# Patient Record
Sex: Female | Born: 1980 | Race: Black or African American | Hispanic: No | Marital: Single | Smoking: Never smoker
Health system: Southern US, Community
[De-identification: ages and names within clinical notes are randomized; demographics above are authoritative.]

## PROBLEM LIST (undated history)

## (undated) DIAGNOSIS — N83209 Unspecified ovarian cyst, unspecified side: Secondary | ICD-10-CM

## (undated) DIAGNOSIS — D259 Leiomyoma of uterus, unspecified: Secondary | ICD-10-CM

## (undated) HISTORY — PX: UTERINE FIBROID SURGERY: SHX826

---

## 2015-08-31 HISTORY — PX: BILATERAL SALPINGECTOMY: SHX5743

## 2017-07-16 ENCOUNTER — Emergency Department (HOSPITAL_COMMUNITY)
Admission: EM | Admit: 2017-07-16 | Discharge: 2017-07-16 | Disposition: A | Discharge status: Home / Self Care | Payer: 59 | Attending: Emergency Medicine | Admitting: Emergency Medicine

## 2017-07-16 ENCOUNTER — Emergency Department (HOSPITAL_COMMUNITY): Payer: 59

## 2017-07-16 ENCOUNTER — Other Ambulatory Visit: Payer: Self-pay

## 2017-07-16 ENCOUNTER — Encounter (HOSPITAL_COMMUNITY): Payer: Self-pay

## 2017-07-16 DIAGNOSIS — B373 Candidiasis of vulva and vagina: Secondary | ICD-10-CM | POA: Diagnosis not present

## 2017-07-16 DIAGNOSIS — N839 Noninflammatory disorder of ovary, fallopian tube and broad ligament, unspecified: Secondary | ICD-10-CM | POA: Insufficient documentation

## 2017-07-16 DIAGNOSIS — R300 Dysuria: Secondary | ICD-10-CM | POA: Insufficient documentation

## 2017-07-16 DIAGNOSIS — N838 Other noninflammatory disorders of ovary, fallopian tube and broad ligament: Secondary | ICD-10-CM

## 2017-07-16 DIAGNOSIS — R102 Pelvic and perineal pain: Secondary | ICD-10-CM | POA: Diagnosis not present

## 2017-07-16 DIAGNOSIS — R112 Nausea with vomiting, unspecified: Secondary | ICD-10-CM | POA: Diagnosis not present

## 2017-07-16 DIAGNOSIS — N83209 Unspecified ovarian cyst, unspecified side: Secondary | ICD-10-CM

## 2017-07-16 DIAGNOSIS — R1032 Left lower quadrant pain: Secondary | ICD-10-CM | POA: Diagnosis present

## 2017-07-16 DIAGNOSIS — B3731 Acute candidiasis of vulva and vagina: Secondary | ICD-10-CM

## 2017-07-16 LAB — CBC WITH DIFFERENTIAL/PLATELET
Basophils Absolute: 0 10*3/uL (ref 0.0–0.1)
Basophils Relative: 1 %
Eosinophils Absolute: 0.2 10*3/uL (ref 0.0–0.7)
Eosinophils Relative: 2 %
HCT: 37.3 % (ref 36.0–46.0)
Hemoglobin: 12.3 g/dL (ref 12.0–15.0)
Lymphocytes Relative: 40 %
Lymphs Abs: 2.8 10*3/uL (ref 0.7–4.0)
MCH: 25.9 pg — ABNORMAL LOW (ref 26.0–34.0)
MCHC: 33 g/dL (ref 30.0–36.0)
MCV: 78.5 fL (ref 78.0–100.0)
Monocytes Absolute: 0.5 10*3/uL (ref 0.1–1.0)
Monocytes Relative: 7 %
Neutro Abs: 3.5 10*3/uL (ref 1.7–7.7)
Neutrophils Relative %: 50 %
Platelets: 298 10*3/uL (ref 150–400)
RBC: 4.75 MIL/uL (ref 3.87–5.11)
RDW: 13.5 % (ref 11.5–15.5)
WBC: 7 10*3/uL (ref 4.0–10.5)

## 2017-07-16 LAB — WET PREP, GENITAL
Clue Cells Wet Prep HPF POC: NONE SEEN
Sperm: NONE SEEN
Trich, Wet Prep: NONE SEEN
WBC, Wet Prep HPF POC: NONE SEEN

## 2017-07-16 LAB — COMPREHENSIVE METABOLIC PANEL
ALT: 13 U/L — ABNORMAL LOW (ref 14–54)
AST: 14 U/L — ABNORMAL LOW (ref 15–41)
Albumin: 3.9 g/dL (ref 3.5–5.0)
Alkaline Phosphatase: 35 U/L — ABNORMAL LOW (ref 38–126)
Anion gap: 9 (ref 5–15)
BUN: 13 mg/dL (ref 6–20)
CO2: 23 mmol/L (ref 22–32)
Calcium: 9.3 mg/dL (ref 8.9–10.3)
Chloride: 107 mmol/L (ref 101–111)
Creatinine, Ser: 0.83 mg/dL (ref 0.44–1.00)
GFR calc Af Amer: >60 mL/min (ref >60)
GFR calc non Af Amer: >60 mL/min (ref >60)
Glucose, Bld: 89 mg/dL (ref 65–99)
Potassium: 3.4 mmol/L — ABNORMAL LOW (ref 3.5–5.1)
Sodium: 139 mmol/L (ref 135–145)
Total Bilirubin: 0.7 mg/dL (ref 0.3–1.2)
Total Protein: 7.8 g/dL (ref 6.5–8.1)

## 2017-07-16 LAB — URINALYSIS, ROUTINE W REFLEX MICROSCOPIC
Bilirubin Urine: NEGATIVE
Glucose, UA: NEGATIVE mg/dL
Hgb urine dipstick: NEGATIVE
Ketones, ur: 20 mg/dL — AB
Leukocytes, UA: NEGATIVE
Nitrite: NEGATIVE
Protein, ur: NEGATIVE mg/dL
Specific Gravity, Urine: 1.026 (ref 1.005–1.030)
pH: 5 (ref 5.0–8.0)

## 2017-07-16 LAB — LIPASE, BLOOD: Lipase: 32 U/L (ref 11–51)

## 2017-07-16 LAB — HCG, QUANTITATIVE, PREGNANCY: hCG, Beta Chain, Quant, S: <1 m[IU]/mL (ref <5)

## 2017-07-16 MED ORDER — OXYCODONE HCL 5 MG PO TABS: 5.0000 mg | ORAL_TABLET | Freq: Four times a day (QID) | ORAL | 0 refills | Status: DC | PRN

## 2017-07-16 MED ORDER — FLUCONAZOLE 150 MG PO TABS
150.0000 mg | ORAL_TABLET | Freq: Once | ORAL | Status: AC
  Administered 2017-07-16: 150 mg via ORAL
  Filled 2017-07-16: qty 1

## 2017-07-16 MED ORDER — MORPHINE SULFATE (PF) 4 MG/ML IV SOLN
4.0000 mg | Freq: Once | INTRAVENOUS | Status: AC
  Administered 2017-07-16: 4 mg via INTRAVENOUS
  Filled 2017-07-16: qty 1

## 2017-07-16 MED ORDER — ONDANSETRON HCL 4 MG PO TABS: 4.0000 mg | ORAL_TABLET | Freq: Three times a day (TID) | ORAL | 0 refills | Status: AC | PRN

## 2017-07-16 MED ORDER — ONDANSETRON HCL 4 MG/2ML IJ SOLN
4.0000 mg | Freq: Once | INTRAMUSCULAR | Status: AC
  Administered 2017-07-16: 4 mg via INTRAVENOUS
  Filled 2017-07-16: qty 2

## 2017-07-16 MED ORDER — ONDANSETRON HCL 4 MG/2ML IJ SOLN: 4.0000 mg | Freq: Once | INTRAMUSCULAR | Status: DC

## 2017-07-16 MED ORDER — SODIUM CHLORIDE 0.9 % IV BOLUS
1000.0000 mL | Freq: Once | INTRAVENOUS | Status: AC
  Administered 2017-07-16: 1000 mL via INTRAVENOUS

## 2017-07-16 NOTE — Discharge Instructions (Addendum)
Alternate 600 mg of ibuprofen and [phone] mg of Tylenol every 3 hours as needed for pain. Do not exceed 4000 mg of Tylenol daily.  Take ibuprofen with food to avoid upset stomach issues.  You may take oxycodone as needed for severe pain but do not drive, drink alcohol, or operate heavy machinery if you take this medication as it may make you drowsy.  You may take Zofran as needed for nausea and vomiting.  Wait around 20 minutes before having anything to eat or drink to get this medication time to work.  Drink plenty of fluids and get plenty of rest.  You may apply heating pad to the abdomen for comfort.  Follow-up in the women's outpatient clinic as soon as possible in the next 2 to 3 days.  Return to the emergency department or go to the [facility] if any concerning signs or symptoms develop such as high fevers, persistent vomiting, or worsening pain.

## 2017-07-16 NOTE — ED Notes (Signed)
Pelvic setup in room 

## 2017-07-16 NOTE — ED Triage Notes (Signed)
Pt sent from doctors office with a copy of her abdominal CT scan. She is complaining of lower abdominal/ pelvic pain. States that she has not vomited today, but did yesterday. A&Ox4.

## 2017-07-16 NOTE — ED Provider Notes (Signed)
[facility] DEPT Provider Note   CSN: [id number] Arrival date & time: 07/16/17  1512     History   Chief Complaint Chief Complaint  Patient presents with  . Abdominal Pain    HPI Danielle Welch is a 37 y.o. female with no significant past medical history presents today for evaluation of acute onset, per aggressively worsening left lower quadrant abdominal pain for 5 days.  She states that pain is constant, burning, worsens when she urinates and with movement.  Pain will sometimes radiate to the left lower back.  She has had associated nausea and a few episodes of nonbloody nonbilious emesis.  None today but she states she has not had anything to eat.  She did note subjective fevers and chills last night.  She notes that she has been constipated and only had a small bowel movement 3 days ago.  She feels as though it is difficult to pass gas additionally.  She does note dysuria but denies hematuria, no melena or hematochezia.  Has not tried anything for her symptoms. she went to a clinic earlier today and underwent a CT scan which showed 12.8 x 8.5 x 8.2 cm cystic lesion along the left posterior uterus with at least one septation, potentially adnexal or ovarian cyst or hydrosalpinx.  She also had a right-sided fluid collection along the uterine margin measuring 4.2 x 5.7 x 4.7 cm.  There is question of free fluid as well.  She did receive a shot of pain medicine while in the clinic which she states was helpful for her pain.  She states her last menstrual period was in late April and was of normal duration for her.  She states her periods are usually regular and last approximately 3 to 4 days.  The history is provided by the patient.    History reviewed. No pertinent past medical history.  There are no active problems to display for this patient.   History reviewed. No pertinent surgical history.   OB History   None      Home Medications    Prior to  Admission medications   Medication Sig Start Date End Date Taking? Authorizing Provider  ibuprofen (ADVIL,MOTRIN) 200 MG tablet Take 200 mg by mouth every 6 (six) hours as needed for moderate pain.   Yes [provider]  ondansetron (ZOFRAN) 4 MG tablet Take 1 tablet (4 mg total) by mouth every 8 (eight) hours as needed for nausea or vomiting. 07/16/17   Nils Flack, Raciel Caffrey A, PA-C  oxyCODONE (ROXICODONE) 5 MG immediate release tablet Take 1 tablet (5 mg total) by mouth every 6 (six) hours as needed for severe pain. 07/16/17   Renita Papa, PA-C    Family History History reviewed. No pertinent family history.  Social History Social History   Tobacco Use  . Smoking status: Never Smoker  . Smokeless tobacco: Never Used  Substance Use Topics  . Alcohol use: Not on file  . Drug use: Not on file     Allergies   Patient has no known allergies.   Review of Systems Review of Systems  Constitutional: Positive for chills and fever.  Gastrointestinal: Positive for abdominal pain, constipation, nausea and vomiting. Negative for blood in stool and diarrhea.  Genitourinary: Positive for dysuria and pelvic pain. Negative for frequency and hematuria.  All other systems reviewed and are negative.    Physical Exam Updated Vital Signs BP 120/68 (BP Location: Left Arm)   Pulse 72   Temp  98.9 F (37.2 C) (Oral)   Resp 18   SpO2 100%   Physical Exam  Constitutional: She appears well-developed and well-nourished. No distress.  HENT:  Head: Normocephalic and atraumatic.  Eyes: Conjunctivae are normal. Right eye exhibits no discharge. Left eye exhibits no discharge.  Neck: No JVD present. No tracheal deviation present.  Cardiovascular: Normal rate, regular rhythm, normal heart sounds and intact distal pulses.  Pulmonary/Chest: Effort normal and breath sounds normal.  Abdominal: She exhibits distension and mass. Bowel sounds are decreased. There is tenderness in the suprapubic area and left  lower quadrant. There is guarding. There is no rigidity, no rebound, no CVA tenderness and negative Murphy's sign.  Well-healed surgical incision in the suprapubic region. There is a palpable mass int he left lower quadrant around the pelvic brim  Genitourinary: Vagina normal. Uterus is tender. Cervix exhibits motion tenderness. Cervix exhibits no friability. Left adnexum displays mass and tenderness.  Genitourinary Comments: Examination performed in the presence of a chaperone.  No masses or lesions to the external genitalia.  Musculoskeletal: She exhibits no edema.  Neurological: She is alert.  Skin: Skin is warm and dry. No erythema.  Psychiatric: She has a normal mood and affect. Her behavior is normal.  Nursing note and vitals reviewed.    ED Treatments / Results  Labs (all labs ordered are listed, but only abnormal results are displayed) Labs Reviewed  WET PREP, GENITAL - Abnormal; Notable for the following components:      Result Value   Yeast Wet Prep HPF POC PRESENT (*)    All other components within normal limits  CBC WITH DIFFERENTIAL/PLATELET - Abnormal; Notable for the following components:   MCH 25.9 (*)    All other components within normal limits  COMPREHENSIVE METABOLIC PANEL - Abnormal; Notable for the following components:   Potassium 3.4 (*)    AST 14 (*)    ALT 13 (*)    Alkaline Phosphatase 35 (*)    All other components within normal limits  URINALYSIS, ROUTINE W REFLEX MICROSCOPIC - Abnormal; Notable for the following components:   APPearance HAZY (*)    Ketones, ur 20 (*)    All other components within normal limits  LIPASE, BLOOD  HCG, QUANTITATIVE, PREGNANCY  GC/CHLAMYDIA PROBE AMP ([place]) NOT AT [facility]    EKG None  Radiology US Pelvic Complete With Transvaginal  Result Date: 07/16/2017 CLINICAL DATA:  Ovarian mass. EXAM: TRANSABDOMINAL AND TRANSVAGINAL ULTRASOUND OF PELVIS TECHNIQUE: Both transabdominal and transvaginal ultrasound  examinations of the pelvis were performed. Transabdominal technique was performed for global imaging of the pelvis including uterus, ovaries, adnexal regions, and pelvic cul-de-sac. It was necessary to proceed with endovaginal exam following the transabdominal exam to visualize the ovaries. COMPARISON:  None. FINDINGS: Uterus Measurements: 11.8 x 5.1 x 5.9 cm. Heterogeneous in appearance with 2.8 cm intramural fibroid in the posterior fundus. Endometrium Thickness: 5 mm.  No focal abnormality visualized. Right ovary Measurements: 6.0 x 3.4 x 4.5 cm. Simple appearing, anechoic 5.1 x 2.9 x 3.5 cm cyst within the right ovary. Left ovary A normal left ovary is not identified. There is a large complex, cystic mass involving the left ovary, measuring 11.5 x 7.8 x 11.0 cm. This mass contains several thin internal septations, as well as a hypoechoic nodule without internal vascularity. There is an adjacent tubular shaped lesion with low-level internal echoes that may represent the fallopian tube. Other findings No abnormal free fluid. IMPRESSION: 1. Large 11.5 cm complex, cystic  mass involving the left ovary containing several thin internal septations and a hypoechoic nodule without definite internal vascularity. Given this appearance, gynecological consultation and contrast-enhanced pelvic MRI are recommended for further evaluation. This recommendation follows the consensus statement: Management of Asymptomatic Ovarian and Other Adnexal Cysts Imaged at [place]: Society of Radiologists in [place]. Radiology 2010; [phone]. 2. Tubular lesion containing low-level internal echoes within the left adnexa that appears separate from the left ovary may represent hematosalpinx. 3. 5.1 cm simple appearing cyst in the right ovary. This is almost certainly benign, but follow up ultrasound is recommended in 1 year according to the Society of Radiologists in Ultrasound 2010 Consensus Conference Statement  (D Clovis Riley et al. Management of Asymptomatic Ovarian and Other Adnexal Cysts Imaged at [place]: Society of Radiologists in [place] Statement 2010. Radiology 256 (Sept 2010): [phone].). 4. Fibroid uterus. Electronically Signed   By: Titus Dubin M.D.   On: 07/16/2017 17:45    Procedures Procedures (including critical care time)  Medications Ordered in ED Medications  ondansetron (ZOFRAN) injection 4 mg (has no administration in time range)  morphine 4 MG/ML injection 4 mg (4 mg Intravenous Given 07/16/17 2106)  sodium chloride 0.9 % bolus 1,000 mL (0 mLs Intravenous Stopped 07/16/17 2255)  ondansetron (ZOFRAN) injection 4 mg (4 mg Intravenous Given 07/16/17 2104)  fluconazole (DIFLUCAN) tablet 150 mg (150 mg Oral Given 07/16/17 2255)     Initial Impression / Assessment and Plan / ED Course  I have reviewed the triage vital signs and the nursing notes.  Pertinent labs & imaging results that were available during my care of the patient were reviewed by me and considered in my medical decision making (see chart for details).     Patient presents for evaluation of left-sided abdominal/pelvic pain for 5 days.  Febrile, vital signs are stable.  She is uncomfortable on palpation of the abdomen but nontoxic in appearance.  She presents with a report of a CT scan that was performed earlier today which shows a large left-sided pelvic mass and a smaller right-sided pelvic mass.  Mass is palpable on examination. Pelvic ultrasound performed today in the ED shows large 11.8 cm complex, cystic mass involving the left ovary containing several thin internal septations and a hypoechoic nodule without definite internal vascularity.  She also has 5.1 cm simple appearing cyst in the right ovary which radiology feels is almost certainly benign.  Lab work reviewed by me shows no leukocytosis, no significant electrolyte abnormalities.  Creatinine, lipase, LFTs are not elevated.  She does have yeast on  her wet prep.  Will give Diflucan in the ED. Spoke with Dr. Elonda Husky with OB/GYN service who states that patient's mass will require surgical removal but this does not need to happen on an emergent basis.  He recommends that she follow-up in the women's clinic on an outpatient basis as soon as possible.  On reevaluation the patient is resting comfortably in no apparent distress.  She was given fluids, pain medicine, and nausea medicine and states she is feeling much better.  She is tolerating p.o. fluids without difficulty and serial abdominal examinations are reassuring.  I doubt obstruction, perforation, appendicitis, colitis, or other acute surgical abdominal pathology.  I had a long discussion with the patient regarding her work-up and answered her and her fianc's questions.  We will discharge with a small amount of oxycodone for severe pain and Zofran for nausea.  Discussed strict ED return precautions.  Patient and patient's  fianc verbalized understanding of and agreement with plan and patient is stable for discharge home at this time.  Final Clinical Impressions(s) / ED Diagnoses   Final diagnoses:  Ovarian mass, left  Yeast vaginitis    ED Discharge Orders        Ordered    oxyCODONE (ROXICODONE) 5 MG immediate release tablet  Every 6 hours PRN     07/16/17 2158    ondansetron (ZOFRAN) 4 MG tablet  Every 8 hours PRN     07/16/17 2158       Renita Papa, PA-C 07/16/17 2340    Mesner, Corene Cornea, MD 07/17/17 [phone]

## 2017-07-19 ENCOUNTER — Inpatient Hospital Stay (HOSPITAL_COMMUNITY)
Admission: AD | Admit: 2017-07-19 | Discharge: 2017-07-19 | Disposition: A | Discharge status: Home / Self Care | Payer: 59 | Source: Ambulatory Visit | Attending: Obstetrics and Gynecology | Admitting: Obstetrics and Gynecology

## 2017-07-19 ENCOUNTER — Encounter (HOSPITAL_COMMUNITY): Payer: Self-pay | Admitting: *Deleted

## 2017-07-19 ENCOUNTER — Inpatient Hospital Stay (HOSPITAL_COMMUNITY): Payer: 59

## 2017-07-19 DIAGNOSIS — R1900 Intra-abdominal and pelvic swelling, mass and lump, unspecified site: Secondary | ICD-10-CM | POA: Insufficient documentation

## 2017-07-19 DIAGNOSIS — R350 Frequency of micturition: Secondary | ICD-10-CM | POA: Diagnosis not present

## 2017-07-19 DIAGNOSIS — Z79899 Other long term (current) drug therapy: Secondary | ICD-10-CM | POA: Diagnosis not present

## 2017-07-19 DIAGNOSIS — R1030 Lower abdominal pain, unspecified: Secondary | ICD-10-CM | POA: Insufficient documentation

## 2017-07-19 DIAGNOSIS — R300 Dysuria: Secondary | ICD-10-CM | POA: Diagnosis present

## 2017-07-19 DIAGNOSIS — N83201 Unspecified ovarian cyst, right side: Secondary | ICD-10-CM | POA: Diagnosis not present

## 2017-07-19 DIAGNOSIS — R109 Unspecified abdominal pain: Secondary | ICD-10-CM

## 2017-07-19 DIAGNOSIS — N838 Other noninflammatory disorders of ovary, fallopian tube and broad ligament: Secondary | ICD-10-CM

## 2017-07-19 DIAGNOSIS — R1032 Left lower quadrant pain: Secondary | ICD-10-CM | POA: Diagnosis not present

## 2017-07-19 DIAGNOSIS — R509 Fever, unspecified: Secondary | ICD-10-CM | POA: Diagnosis present

## 2017-07-19 HISTORY — DX: Leiomyoma of uterus, unspecified: D25.9

## 2017-07-19 HISTORY — DX: Unspecified ovarian cyst, unspecified side: N83.209

## 2017-07-19 LAB — URINALYSIS, ROUTINE W REFLEX MICROSCOPIC
BILIRUBIN URINE: NEGATIVE
Glucose, UA: NEGATIVE mg/dL
Hgb urine dipstick: NEGATIVE
KETONES UR: NEGATIVE mg/dL
LEUKOCYTES UA: NEGATIVE
NITRITE: NEGATIVE
PH: 7 (ref 5.0–8.0)
Protein, ur: NEGATIVE mg/dL
SPECIFIC GRAVITY, URINE: 1.011 (ref 1.005–1.030)

## 2017-07-19 LAB — CBC
HCT: 38.8 % (ref 36.0–46.0)
Hemoglobin: 12.9 g/dL (ref 12.0–15.0)
MCH: 26.5 pg (ref 26.0–34.0)
MCHC: 33.2 g/dL (ref 30.0–36.0)
MCV: 79.8 fL (ref 78.0–100.0)
PLATELETS: 285 10*3/uL (ref 150–400)
RBC: 4.86 MIL/uL (ref 3.87–5.11)
RDW: 13.6 % (ref 11.5–15.5)
WBC: 6.2 10*3/uL (ref 4.0–10.5)

## 2017-07-19 LAB — POCT PREGNANCY, URINE: PREG TEST UR: NEGATIVE

## 2017-07-19 LAB — GC/CHLAMYDIA PROBE AMP (~~LOC~~) NOT AT ARMC
Chlamydia: NEGATIVE
Neisseria Gonorrhea: NEGATIVE

## 2017-07-19 MED ORDER — OXYCODONE-ACETAMINOPHEN 5-325 MG PO TABS
1.0000 | ORAL_TABLET | Freq: Three times a day (TID) | ORAL | 0 refills | Status: AC | PRN
Start: 2017-07-19 — End: ?

## 2017-07-19 MED ORDER — KETOROLAC TROMETHAMINE 60 MG/2ML IM SOLN
60.0000 mg | Freq: Once | INTRAMUSCULAR | Status: AC
  Administered 2017-07-19: 60 mg via INTRAMUSCULAR
  Filled 2017-07-19: qty 2

## 2017-07-19 MED ORDER — IBUPROFEN 800 MG PO TABS: 800.0000 mg | ORAL_TABLET | Freq: Three times a day (TID) | ORAL | 0 refills | Status: DC

## 2017-07-19 NOTE — MAU Note (Signed)
Pt reports lower abd pain x one week, was evaluated. Reports dysuria, voiding small amounts and ? Fever off/on

## 2017-07-19 NOTE — MAU Provider Note (Signed)
History     CSN: [id number]  Arrival date and time: 07/19/17 0263   First Provider Initiated Contact with Patient 07/19/17 1106      Chief Complaint  Patient presents with  . Abdominal Pain  . Dysuria  . Fever   HPI  Danielle Welch is a 37 y.o. G0P0000 non pregnant female who presents with abdominal pain. She was seen 5/17 at [facility] for same complaint. Has hx of PID, TOA, & bilateral salpingectomy in 2017 in [place] (see care everywhere records). On 5/17, she had imaging that shows a 5 cm right ovarian cyst, a lesion in left adnexa c/w hematosalpinx, & a 11.5 cm left ovarian mass. Was discharged home with f/u scheduled for 6/14 in our ob/gyn clinic.  Patient reports lower abdominal pain that started over a week ago and has progressively gotten worse. Pain is throughout her abdomen but worse in LLQ. Rates pain 10/10. Took percocet yesterday with mild relief. Has not taken medication today.  Per charge RN, pt reports dysuria, difficulty emptying bladder, and occasional fevers at home. During my discussion with patient she denies fevers or dysuria, but does reports increased urinary frequency.  Her PCP sent her to [facility] for CT scan --- she brought records of scan results with her; CT was performed 5/17.   Past Medical History:  Diagnosis Date  . Ovarian cyst   . Uterine fibroid     Past Surgical History:  Procedure Laterality Date  . BILATERAL SALPINGECTOMY    . UTERINE FIBROID SURGERY      No family history on file.  Social History   Tobacco Use  . Smoking status: Never Smoker  . Smokeless tobacco: Never Used  Substance Use Topics  . Alcohol use: Never    Frequency: Never  . Drug use: Never    Allergies: No Known Allergies  Medications Prior to Admission  Medication Sig Dispense Refill Last Dose  . ibuprofen (ADVIL,MOTRIN) 200 MG tablet Take 200 mg by mouth every 6 (six) hours as needed for moderate pain.   07/15/2017 at Unknown time  . ondansetron (ZOFRAN)  4 MG tablet Take 1 tablet (4 mg total) by mouth every 8 (eight) hours as needed for nausea or vomiting. 12 tablet 0   . oxyCODONE (ROXICODONE) 5 MG immediate release tablet Take 1 tablet (5 mg total) by mouth every 6 (six) hours as needed for severe pain. 20 tablet 0     Review of Systems  Constitutional: Negative.   Gastrointestinal: Positive for abdominal distention, abdominal pain, nausea and vomiting. Negative for constipation and diarrhea.  Genitourinary: Negative.    Physical Exam   Blood pressure 117/87, pulse 77, temperature 98.6 F (37 C), temperature source Oral, resp. rate 18, height 5\' 2"  (1.575 m), weight 183 lb (83 kg), last menstrual period 06/28/2017, SpO2 100 %.  Physical Exam  Nursing note and vitals reviewed. Constitutional: She is oriented to person, place, and time. She appears well-developed and well-nourished. No distress.  HENT:  Head: Normocephalic and atraumatic.  Eyes: Conjunctivae are normal. Right eye exhibits no discharge. Left eye exhibits no discharge. No scleral icterus.  Neck: Normal range of motion.  Cardiovascular: Normal rate, regular rhythm and normal heart sounds.  No murmur heard. Respiratory: Effort normal and breath sounds normal. No respiratory distress. She has no wheezes.  GI: Soft. Bowel sounds are normal. She exhibits distension. There is tenderness in the right lower quadrant and suprapubic area. There is no rigidity, no rebound and no guarding.  Neurological: She is alert and oriented to person, place, and time.  Skin: Skin is warm and dry. She is not diaphoretic.  Psychiatric: She has a normal mood and affect. Her behavior is normal. Judgment and thought content normal.    MAU Course  Procedures Results for orders placed or performed during the hospital encounter of 07/19/17 (from the past 24 hour(s))  Urinalysis, Routine w reflex microscopic     Status: None   Collection Time: 07/19/17  9:42 AM  Result Value Ref Range   Color,  Urine YELLOW YELLOW   APPearance CLEAR CLEAR   Specific Gravity, Urine 1.011 1.005 - 1.030   pH 7.0 5.0 - 8.0   Glucose, UA NEGATIVE NEGATIVE mg/dL   Hgb urine dipstick NEGATIVE NEGATIVE   Bilirubin Urine NEGATIVE NEGATIVE   Ketones, ur NEGATIVE NEGATIVE mg/dL   Protein, ur NEGATIVE NEGATIVE mg/dL   Nitrite NEGATIVE NEGATIVE   Leukocytes, UA NEGATIVE NEGATIVE  Pregnancy, urine POC     Status: None   Collection Time: 07/19/17 11:10 AM  Result Value Ref Range   Preg Test, Ur NEGATIVE NEGATIVE  CBC     Status: None   Collection Time: 07/19/17 12:07 PM  Result Value Ref Range   WBC 6.2 4.0 - 10.5 K/uL   RBC 4.86 3.87 - 5.11 MIL/uL   Hemoglobin 12.9 12.0 - 15.0 g/dL   HCT 38.8 36.0 - 46.0 %   MCV 79.8 78.0 - 100.0 fL   MCH 26.5 26.0 - 34.0 pg   MCHC 33.2 30.0 - 36.0 g/dL   RDW 13.6 11.5 - 15.5 %   Platelets 285 150 - 400 K/uL   US Pelvis (transabdominal Only)  Result Date: 07/19/2017 CLINICAL DATA:  Pelvic pain. Status post bilateral salpingectomy for tubo-ovarian abscess EXAM: TRANSABDOMINAL ULTRASOUND OF PELVIS TECHNIQUE: Transabdominal ultrasound examination of the pelvis was performed including evaluation of the uterus, ovaries, adnexal regions, and pelvic cul-de-sac. COMPARISON:  07/16/2017 FINDINGS: Uterus Measurements: 16.1 x 6.1 x 8.6 cm. At least 4 fibroids are identified. The largest is in the right lateral posterior myometrium measuring 2.5 x 2.6 x 2.6 cm. Endometrium Thickness: 5.2 mm.  No focal abnormality visualized. Right ovary Measurements: Not visualized.  Normal appearance/no adnexal mass. Left ovary Measurements: Not visualized.  Normal appearance/no adnexal mass. Other findings: Loculated fluid is identified within bilateral adnexal regions. Additionally, there is a large predominantly cystic mass within the left adnexal region measuring 11.4 x 7.7 x 9.6 cm. Indeterminate. IMPRESSION: 1. Again noted is loculated fluid within the pelvis as well as a large indeterminate  cystic mass within the left adnexal region. In a patient who has a history of tubo-ovarian abscess cannot rule out recurrent infection. Benign or malignant cystic not excluded. Further evaluation with contrast enhanced CT of the abdomen and pelvis or contrast enhanced pelvic MRI is advised for further characterization. 2. Nonvisualization of the ovaries. Electronically Signed   By: Kerby Moors M.D.   On: 07/19/2017 13:33    MDM UPT negative VSS, pt afebrile Toradol 60 mg IM -- brought pain down to 6/10 GC/CT & wet prep collected on 5/17, wet prep only showed yeast, GC/CT still pending  C/w Dr. Elly Modena. Reviewed imaging from today, CT from 5/17, and records from [place] in [place]. Will keep f/u appt with Dr. Hulan Fray. Rx meds for pain management. Ibuprofen on schedule and narcotic to take sparingly for breakthrough pain.   Called office and got pt earlier appointment on 6/7. Assessment and Plan  A:  1. Pelvic mass in female   2. Ovarian mass   3. Abdominal pain    P: Discharge home Rx ibuprofen 800 mg --- discussed with patient to take on schedule Rx percocet, take prn between ibuprofen - discussed using narcotics sparingly and possibility of constipation which would worsen her symptoms F/u with Dr. Hulan Fray on 6/7  Jorje Guild 07/19/2017, 11:06 AM

## 2017-07-19 NOTE — Discharge Instructions (Signed)
Constipation, Adult Constipation is when a person has fewer bowel movements in a week than normal, has difficulty having a bowel movement, or has stools that are dry, hard, or larger than normal. Constipation may be caused by an underlying condition. It may become worse with age if a person takes certain medicines and does not take in enough fluids. Follow these instructions at home: Eating and drinking   Eat foods that have a lot of fiber, such as fresh fruits and vegetables, whole grains, and beans.  Limit foods that are high in fat, low in fiber, or overly processed, such as french fries, hamburgers, cookies, candies, and soda.  Drink enough fluid to keep your urine clear or pale yellow. General instructions  Exercise regularly or as told by your health care provider.  Go to the restroom when you have the urge to go. Do not hold it in.  Take over-the-counter and prescription medicines only as told by your health care provider. These include any fiber supplements.  Practice pelvic floor retraining exercises, such as deep breathing while relaxing the lower abdomen and pelvic floor relaxation during bowel movements.  Watch your condition for any changes.  Keep all follow-up visits as told by your health care provider. This is important. Contact a health care provider if:  You have pain that gets worse.  You have a fever.  You do not have a bowel movement after 4 days.  You vomit.  You are not hungry.  You lose weight.  You are bleeding from the anus.  You have thin, pencil-like stools. Get help right away if:  You have a fever and your symptoms suddenly get worse.  You leak stool or have blood in your stool.  Your abdomen is bloated.  You have severe pain in your abdomen.  You feel dizzy or you faint. This information is not intended to replace advice given to you by your health care provider. Make sure you discuss any questions you have with your health care  provider. Document Released: 11/15/2003 Document Revised: 09/06/2015 Document Reviewed: 08/07/2015 Elsevier Interactive Patient Education  2018 [employer]. Pelvic Mass A pelvic mass is an abnormal growth in the pelvis. The pelvis is the area between your hip bones. It includes the bladder and the rectum in males and females, and also the uterus and ovaries in females. What are the causes? Many things can cause a pelvic mass, including:  Cancer.  Fibroids of the uterus.  Ovarian cysts.  Infection.  Ectopic pregnancy.  What are the signs or symptoms? Symptoms of a pelvic mass may include:  Cramping.  Nausea.  Diarrhea.  Fever.  Vomiting.  Weakness.  Pain in the pelvis, side, or back.  Weight loss.  Constipation.  Problems with vaginal bleeding, including: ? Light or heavy bleeding with or without blood clots. ? Irregular menstruation. ? Pain with menstruation.  Problems with urination, including: ? Frequent urination. ? Inability to empty the bladder completely. ? Urinating very small amounts. ? Pain with urination. ? Bloody urine.  Some pelvic masses do not cause symptoms. How is this diagnosed? To make a diagnosis, your health care provider will need to learn more about the mass. You may have tests or procedures done, such as:  Blood tests.  X-rays.  Ultrasound.  CT scan.  MRI.  A surgery to look inside of your abdomen with cameras (laparoscopy).  A biopsy that is performed with a needle or during laparoscopy or surgery.  In some cases, what seemed  like a pelvic mass may actually be something else, such as a mass in one of the organs that are near the pelvis, an infection (abscess) or scar tissue (adhesions) that formed after a surgery. How is this treated? Treatment will depend on the cause of the mass. Follow these instructions at home: What you need to do at home will depend on the cause of the mass. Follow the instructions that your  health care provider gives to you. In general:  Keep all follow-up visits as directed by your health care provider. This is important.  Take medicines only as directed by your health care provider.  Follow any restrictions that are given to you by your health care provider.  Contact a health care provider if:  You develop new symptoms. Get help right away if:  You vomit bright red blood or vomit material that looks like coffee grounds.  You have blood in your stools, or the stools turn black and tarry.  You have an abnormal or increased amount of vaginal bleeding.  You have a fever.  You develop easy bruising or bleeding.  You develop sudden or worsening pain that is not controlled by your medicine.  You feel worsening weakness, or you have a fainting episode.  You feel that the mass has suddenly gotten larger.  You develop severe bloating in your abdomen or your pelvis.  You cannot pass any urine.  You are unable to have a bowel movement. This information is not intended to replace advice given to you by your health care provider. Make sure you discuss any questions you have with your health care provider. Document Released: 05/26/2006 Document Revised: 07/25/2015 Document Reviewed: 10/02/2013 Elsevier Interactive Patient Education  2018 [employer].

## 2017-08-05 ENCOUNTER — Other Ambulatory Visit: Payer: Self-pay | Admitting: Student

## 2017-08-06 ENCOUNTER — Encounter: Payer: Self-pay | Admitting: General Practice

## 2017-08-06 ENCOUNTER — Ambulatory Visit (INDEPENDENT_AMBULATORY_CARE_PROVIDER_SITE_OTHER): Payer: 59 | Admitting: Obstetrics & Gynecology

## 2017-08-06 ENCOUNTER — Encounter: Payer: Self-pay | Admitting: Obstetrics & Gynecology

## 2017-08-06 VITALS — BP 118/79 | HR 67 | Ht 63.0 in | Wt 186.0 lb

## 2017-08-06 DIAGNOSIS — Z113 Encounter for screening for infections with a predominantly sexual mode of transmission: Secondary | ICD-10-CM | POA: Diagnosis not present

## 2017-08-06 DIAGNOSIS — Z23 Encounter for immunization: Secondary | ICD-10-CM | POA: Diagnosis not present

## 2017-08-06 DIAGNOSIS — N83202 Unspecified ovarian cyst, left side: Secondary | ICD-10-CM | POA: Diagnosis not present

## 2017-08-06 DIAGNOSIS — Z0000 Encounter for general adult medical examination without abnormal findings: Secondary | ICD-10-CM | POA: Diagnosis not present

## 2017-08-06 DIAGNOSIS — Z1151 Encounter for screening for human papillomavirus (HPV): Secondary | ICD-10-CM | POA: Diagnosis not present

## 2017-08-06 NOTE — Progress Notes (Signed)
Patient ID: Danielle Welch, female   DOB: 1980/12/18, 37 y.o.   MRN: [id number]  Chief Complaint  Patient presents with  . Follow-up    from ER    HPI Danielle Welch is a 37 y.o. female. Engaged P0 here for followup from ER visit done for pelvic pain. An 11 cm left adnexal mass was seen. 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 She reports that her pelvic pain has resolved. She is interested in IVF, does not want to have her uterus/ovaries removed.  HPI  Past Medical History:  Diagnosis Date  . Ovarian cyst   . Uterine fibroid     Past Surgical History:  Procedure Laterality Date  . BILATERAL SALPINGECTOMY Bilateral 08/2015  . UTERINE FIBROID SURGERY      No family history on file.  Social History Social History   Tobacco Use  . Smoking status: Never Smoker  . Smokeless tobacco: Never Used  Substance Use Topics  . Alcohol use: Never    Frequency: Never  . Drug use: Never    No Known Allergies  Current Outpatient Medications  Medication Sig Dispense Refill  . ibuprofen (ADVIL,MOTRIN) 600 MG tablet TAKE 1 TABLET BY MOUTH THREE TIMES A DAY 60 tablet 0  . ondansetron (ZOFRAN) 4 MG tablet Take 1 tablet (4 mg total) by mouth every 8 (eight) hours as needed for nausea or vomiting. 12 tablet 0  . oxyCODONE-acetaminophen (PERCOCET/ROXICET) 5-325 MG tablet Take 1 tablet by mouth every 8 (eight) hours as needed for moderate pain (use for breakthrough pain between doses of ibuprofen). 20 tablet 0   No current facility-administered medications for this visit.     Review of Systems Review of Systems She moved here from [place] about a year ago.  Blood pressure 118/79, pulse 67, height 5\' 3"  (1.6 m), weight 186 lb (84.4 kg), last menstrual period 07/23/2017.  Physical Exam Physical Exam Breathing, conversing, and ambulating normally Well nourished, well hydrated Black female, no apparent distress She declined an interpretor Abd- benign,  vertical scar extending from symphysis pubis to above her umbilicus Palpable ovarian mass up to her umbilicus Cervix- EXTREMELY anteverted, moved by pelvic mass Data Reviewed Her u/s showed a large left adnexal mass  Assessment    Large left adnexal mass- check CA-125 and MRI Preventative care- pap smear done today  TDAP today Come back 2 weeks for results  Plan    See above       Jiali Linney C Glenis Musolf 08/06/2017, 9:33 AM

## 2017-08-07 LAB — BASIC METABOLIC PANEL
BUN/Creatinine Ratio: 6 — ABNORMAL LOW (ref 9–23)
BUN: 5 mg/dL — AB (ref 6–20)
CO2: 23 mmol/L (ref 20–29)
CREATININE: 0.89 mg/dL (ref 0.57–1.00)
Calcium: 9.5 mg/dL (ref 8.7–10.2)
Chloride: 104 mmol/L (ref 96–106)
GFR, EST AFRICAN AMERICAN: 96 mL/min/{1.73_m2} (ref >59)
GFR, EST NON AFRICAN AMERICAN: 84 mL/min/{1.73_m2} (ref >59)
Glucose: 76 mg/dL (ref 65–99)
POTASSIUM: 4.3 mmol/L (ref 3.5–5.2)
Sodium: 139 mmol/L (ref 134–144)

## 2017-08-07 LAB — CA 125: Cancer Antigen (CA) 125: 40.1 U/mL — ABNORMAL HIGH (ref 0.0–38.1)

## 2017-08-09 ENCOUNTER — Telehealth: Payer: Self-pay | Admitting: General Practice

## 2017-08-09 ENCOUNTER — Other Ambulatory Visit: Payer: Self-pay | Admitting: General Practice

## 2017-08-09 DIAGNOSIS — N838 Other noninflammatory disorders of ovary, fallopian tube and broad ligament: Secondary | ICD-10-CM

## 2017-08-09 LAB — CYTOLOGY - PAP
Adequacy: ABSENT
Chlamydia: NEGATIVE
Diagnosis: NEGATIVE
HPV (WINDOPATH): NOT DETECTED
Neisseria Gonorrhea: NEGATIVE

## 2017-08-09 NOTE — Telephone Encounter (Signed)
-----   Message from Emily Filbert, MD sent at 08/09/2017  7:59 AM EDT ----- She will need an appt with gyn onc because her tumor marker was elevated.

## 2017-08-09 NOTE — Telephone Encounter (Signed)
Called patient with [employer] interpreter [phone], no answer- left message on voicemail stating we are trying to reach you regarding a referral appt, please call us back. Scheduled GYN ONC appt same day as MRI 6/17 @ 1215p. Per chart review,patient has follow up appt in our office already scheduled. Appt is likely not needed given referral.

## 2017-08-10 NOTE — Telephone Encounter (Signed)
Called patient with [employer] interpreter [phone], no answer- left message on voicemail to call us back regarding an appt we have scheduled for you. Also called and left message on emergency contact's number. Will send letter.

## 2017-08-11 ENCOUNTER — Encounter: Payer: Self-pay | Admitting: General Practice

## 2017-08-11 NOTE — Progress Notes (Signed)
Patient came by the office regarding our phone call. I informed her of appt with GYN ONC. Discussed follow up appt in our office wasn't required. Patient verbalized understanding to all & understands appt on 6/27 will be cancelled. [employer] video interpreter [phone] used for encounter.

## 2017-08-13 ENCOUNTER — Encounter: Payer: 59 | Admitting: Obstetrics & Gynecology

## 2017-08-16 ENCOUNTER — Ambulatory Visit: Payer: 59 | Admitting: Obstetrics

## 2017-08-16 ENCOUNTER — Ambulatory Visit (HOSPITAL_COMMUNITY): Payer: 59

## 2017-08-17 ENCOUNTER — Telehealth: Payer: Self-pay | Admitting: *Deleted

## 2017-08-17 NOTE — Telephone Encounter (Signed)
Called and left the patient a message to call the office using [employer]

## 2017-08-20 ENCOUNTER — Ambulatory Visit (HOSPITAL_COMMUNITY): Payer: 59

## 2017-08-26 ENCOUNTER — Ambulatory Visit: Payer: 59 | Admitting: Obstetrics & Gynecology

## 2018-03-17 ENCOUNTER — Other Ambulatory Visit: Payer: Self-pay | Admitting: Internal Medicine

## 2018-03-17 DIAGNOSIS — N839 Noninflammatory disorder of ovary, fallopian tube and broad ligament, unspecified: Secondary | ICD-10-CM

## 2018-03-17 DIAGNOSIS — N83202 Unspecified ovarian cyst, left side: Secondary | ICD-10-CM

## 2018-03-21 ENCOUNTER — Other Ambulatory Visit: Payer: 59

## 2019-05-22 IMAGING — US US PELVIS COMPLETE
1 series · 15 of 25 positions shown · non-contrast
Comparison: 07/16/2017

CLINICAL DATA: Pelvic pain. Status post bilateral salpingectomy for
tubo-ovarian abscess

EXAM:
TRANSABDOMINAL ULTRASOUND OF PELVIS
TECHNIQUE: Transabdominal ultrasound examination of the pelvis was performed
including evaluation of the uterus, ovaries, adnexal regions, and
pelvic cul-de-sac.

[Series 1: us pelvis complete · 15 of 48 slices shown]
[im 1/48]
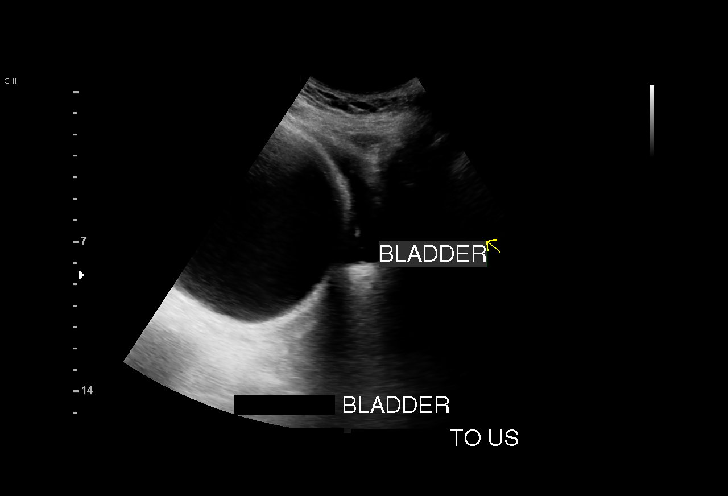
[im 4/48]
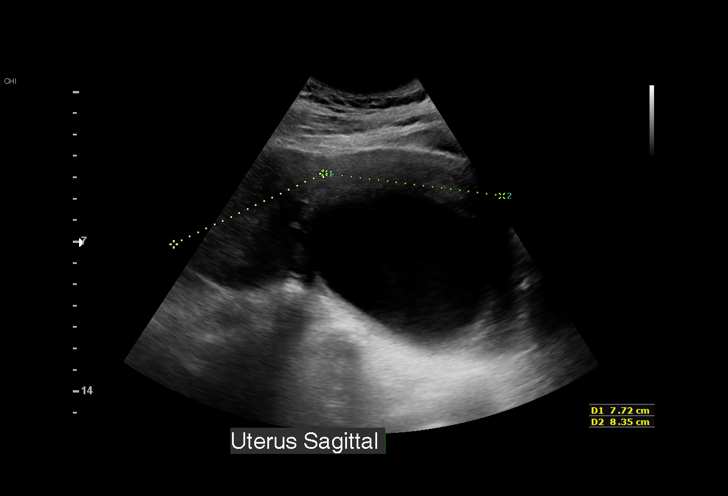
[im 8/48]
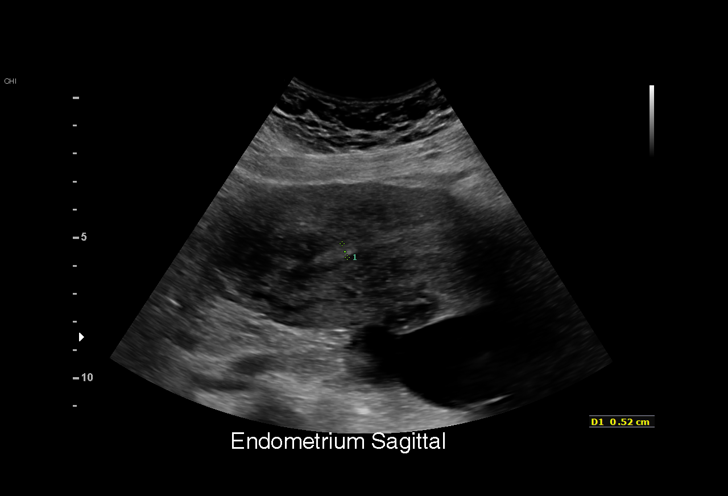
[im 10/48]
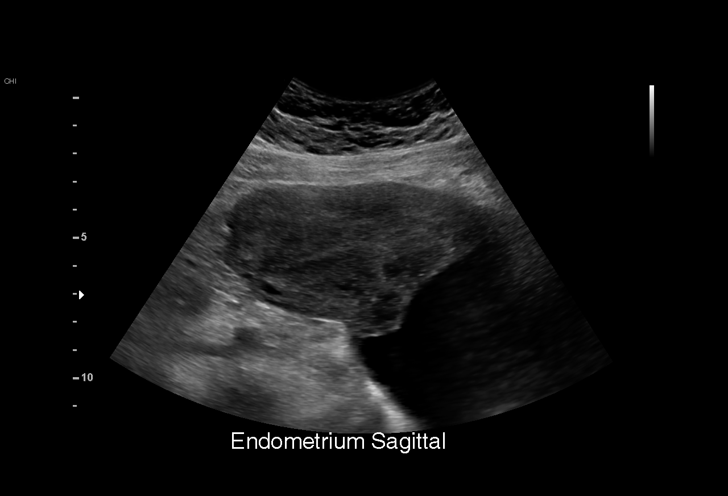
[im 14/48]
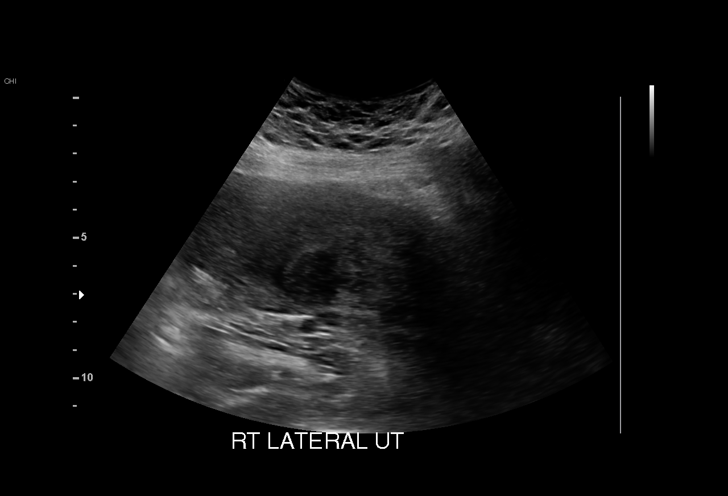
[im 18/48]
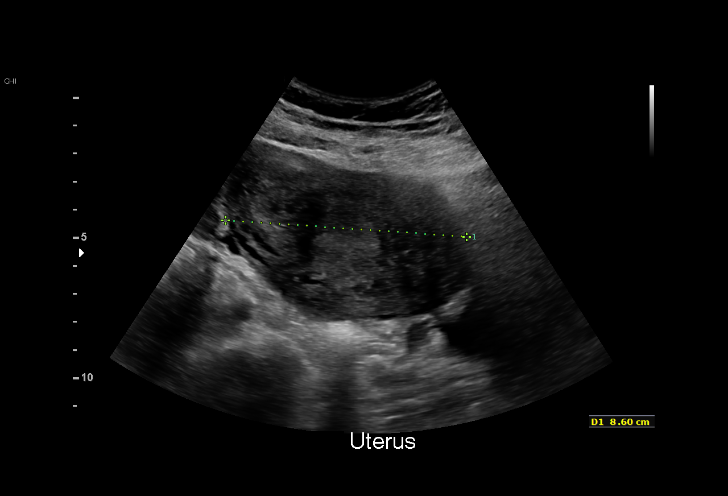
[im 20/48]
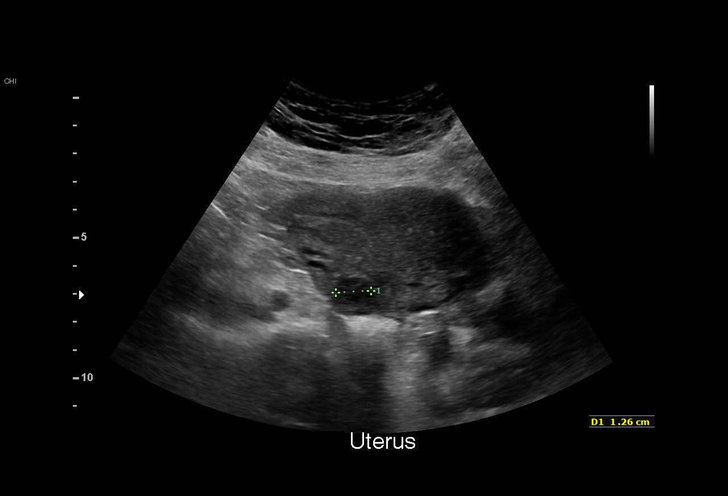
[im 24/48]
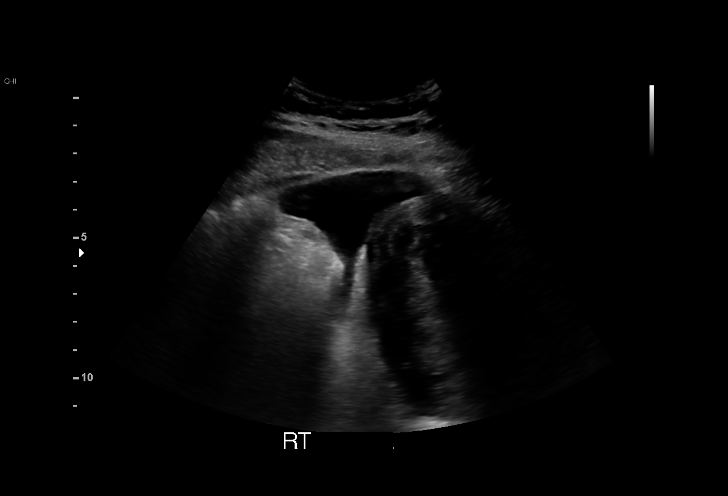
[im 28/48]
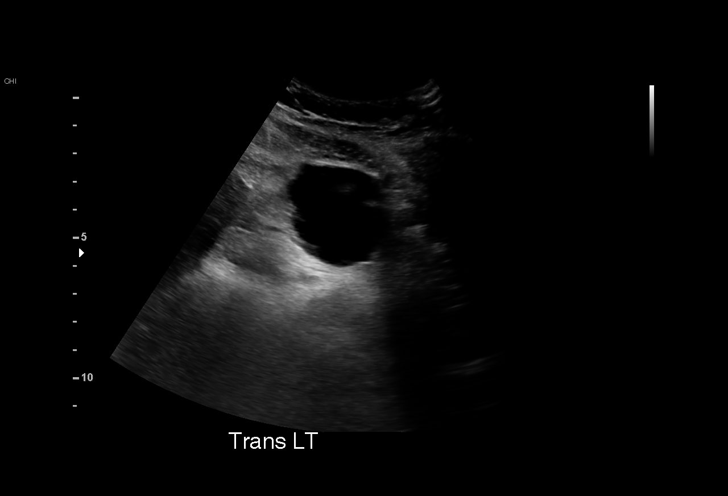
[im 30/48]
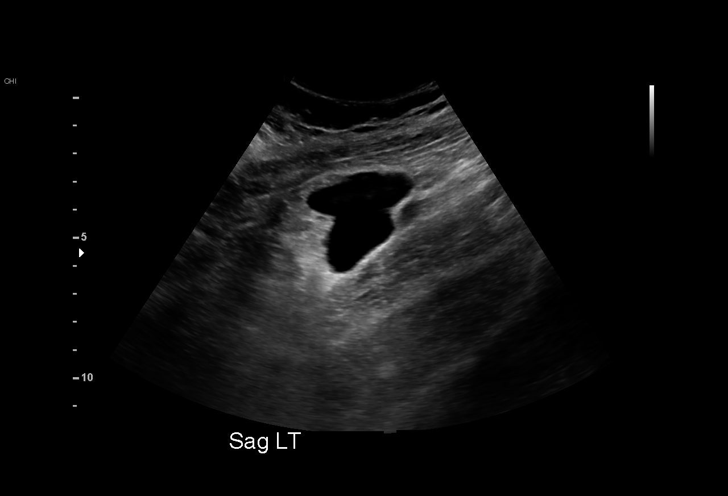
[im 34/48]
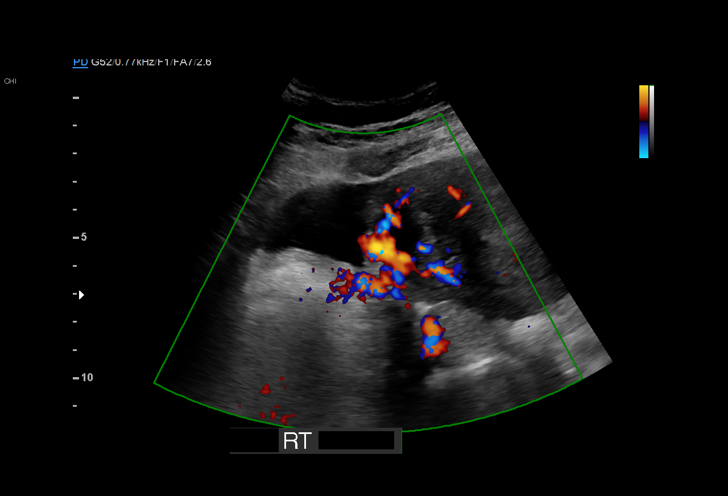
[im 38/48]
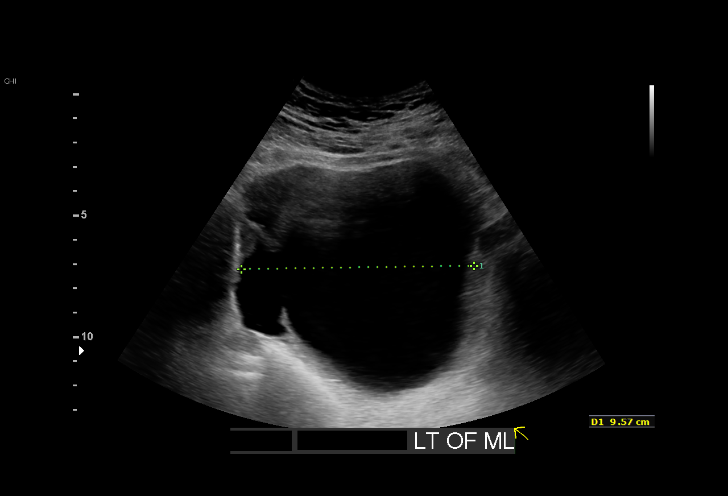
[im 40/48]
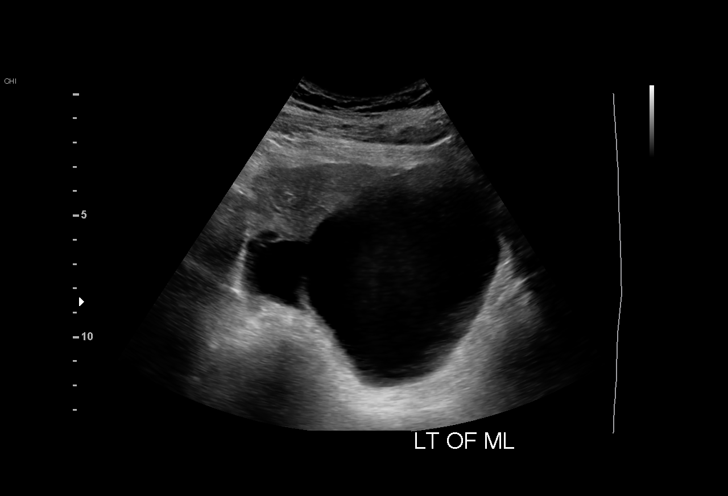
[im 44/48]
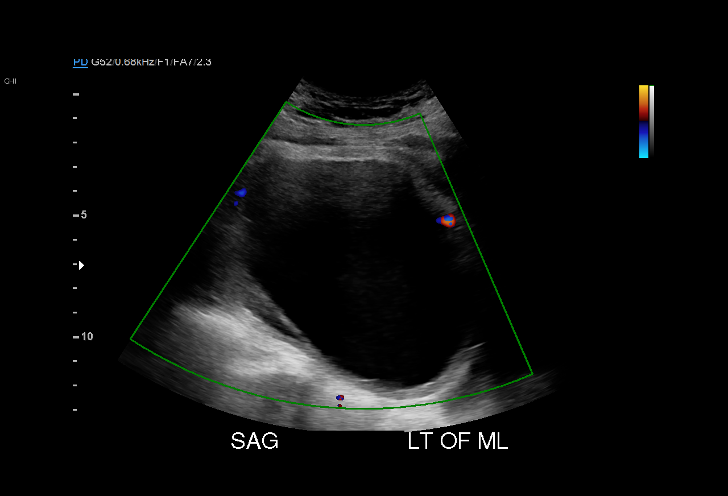
[im 48/48]
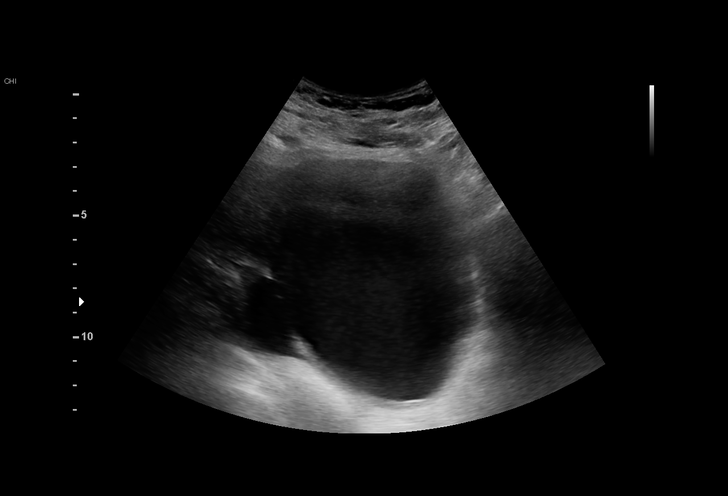

[15 of 25 positions shown; findings below may reference images not displayed]

FINDINGS: Uterus

Measurements: 16.1 x 6.1 x 8.6 cm. At least 4 fibroids are
identified. The largest is in the right lateral posterior myometrium
measuring 2.5 x 2.6 x 2.6 cm..

Endometrium

Thickness: 5.2 mm.  No focal abnormality visualized.

Right ovary

Measurements: Not visualized..  Normal appearance/no adnexal mass.

Left ovary

Measurements: Not visualized.  Normal appearance/no adnexal mass.

Other findings: Loculated fluid is identified within bilateral
adnexal regions. Additionally, there is a large predominantly cystic
mass within the left adnexal region measuring 11.4 x 7.7 x 9.6 cm.
Indeterminate.
IMPRESSION: 1. Again noted is loculated fluid within the pelvis as well as a
large indeterminate cystic mass within the left adnexal region. In a
patient who has a history of tubo-ovarian abscess cannot rule out
recurrent infection. Benign or malignant cystic not excluded.
Further evaluation with contrast enhanced CT of the abdomen and
pelvis or contrast enhanced pelvic MRI is advised for further
characterization.
2. Nonvisualization of the ovaries.

## 2020-05-13 IMAGING — US US PELVIS COMPLETE TRANSABD/TRANSVAG
1 series · 13 of 25 positions shown · non-contrast
Comparison: None.

CLINICAL DATA: Ovarian mass.



[Series 1: us pelvis complete transabd/transvag · 0.31mm/px · 13 of 85 slices shown]
[im 1/85]
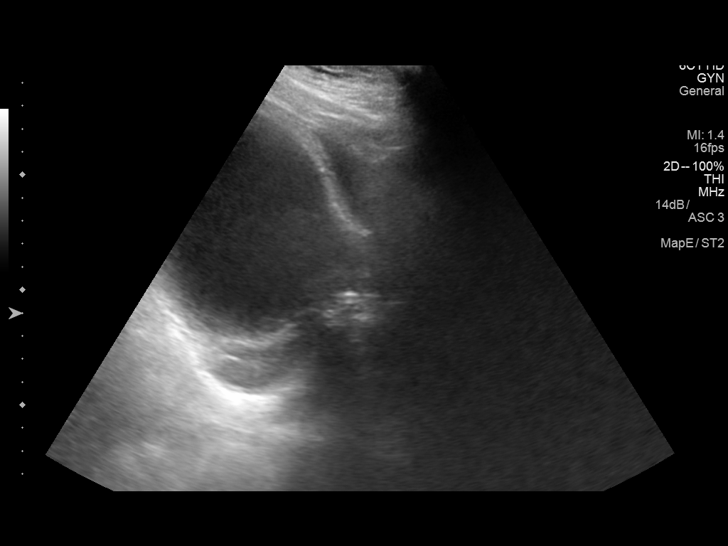
[im 8/85]
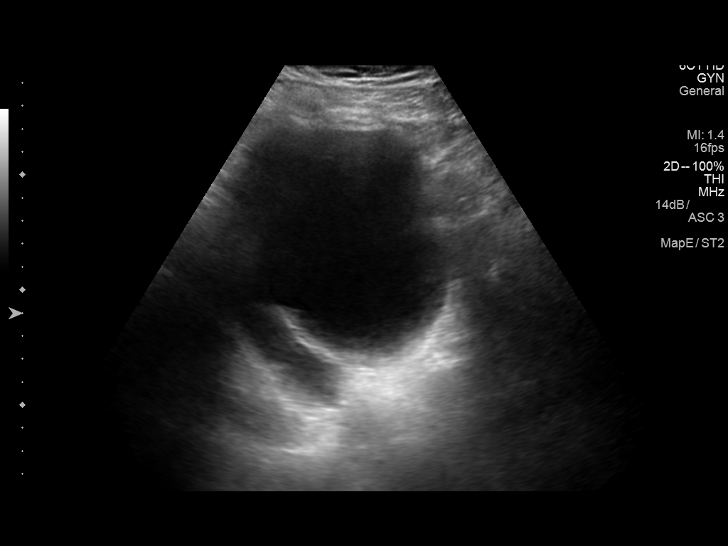
[im 15/85]
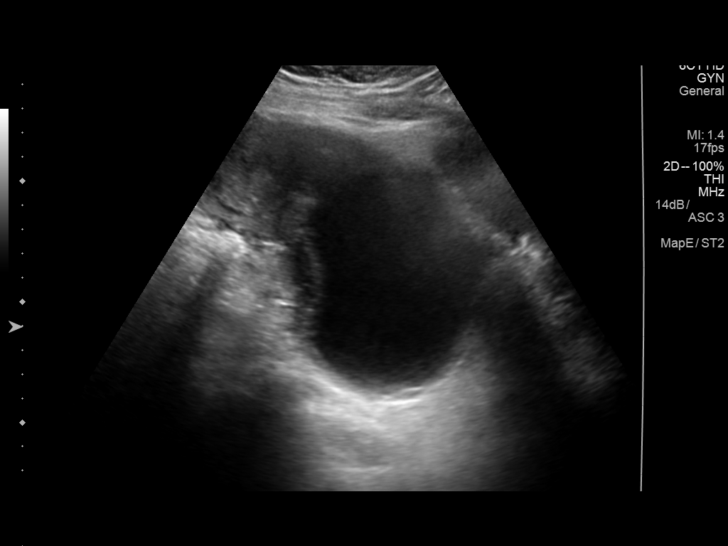
[im 22/85]
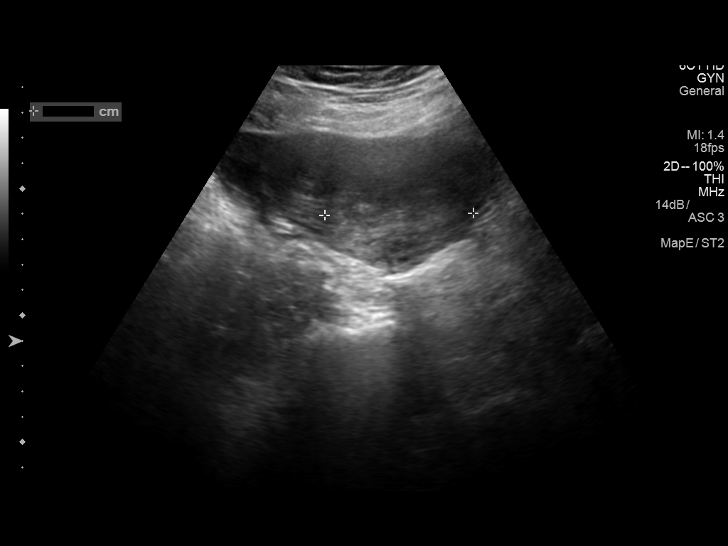
[im 29/85]
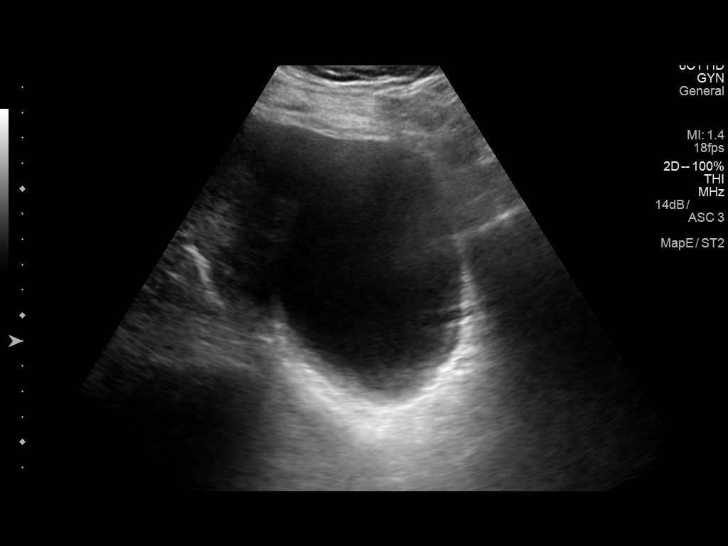
[im 36/85]
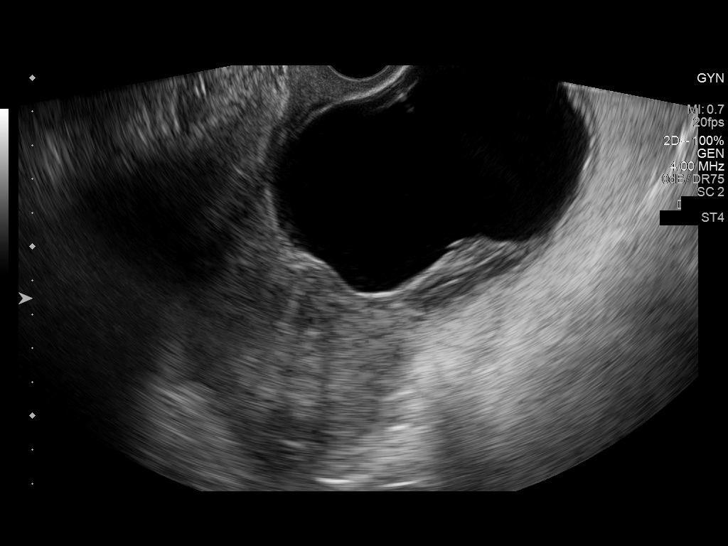
[im 43/85]
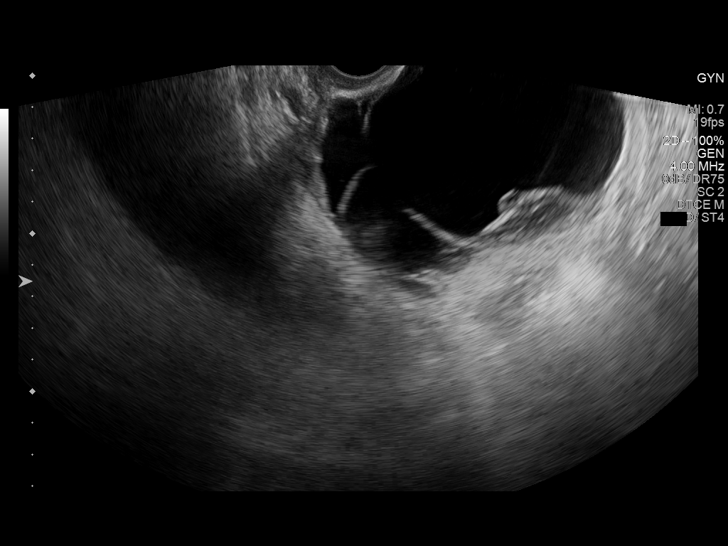
[im 50/85]
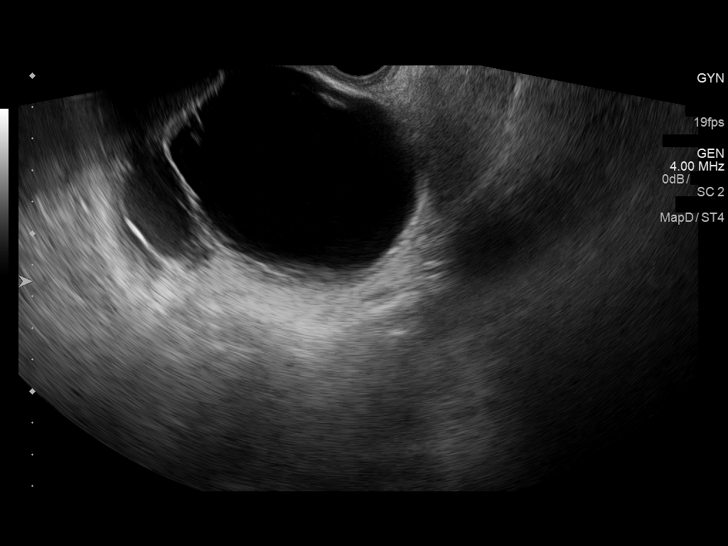
[im 57/85]
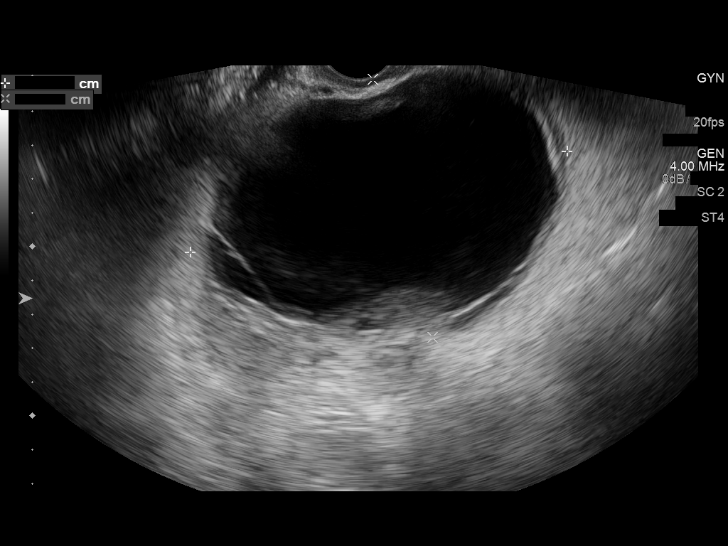
[im 64/85]
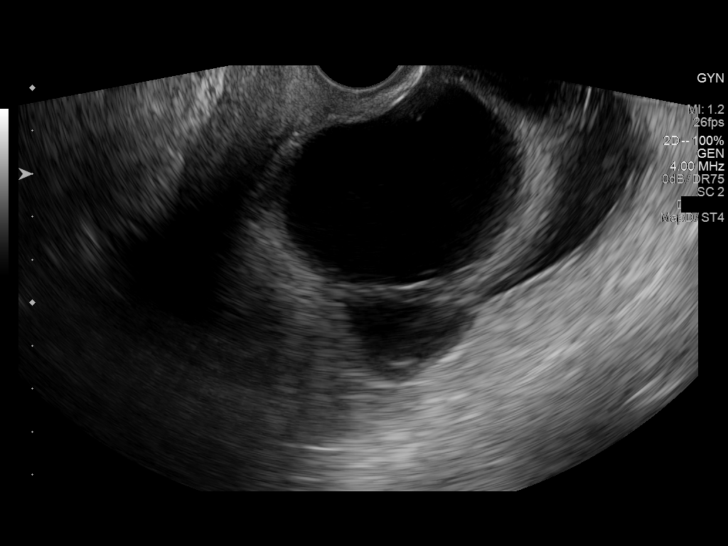
[im 71/85]
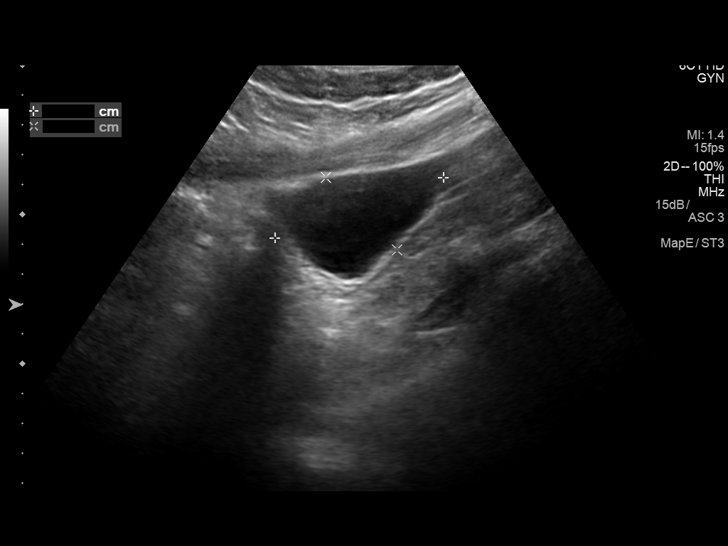
[im 78/85]
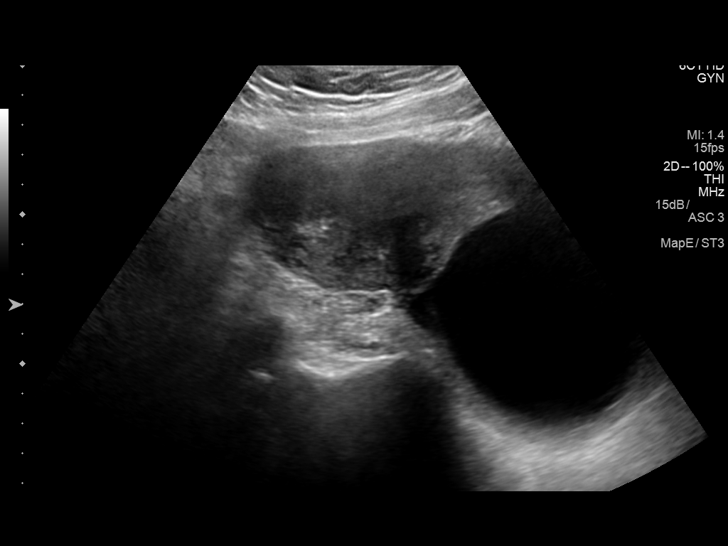
[im 85/85]
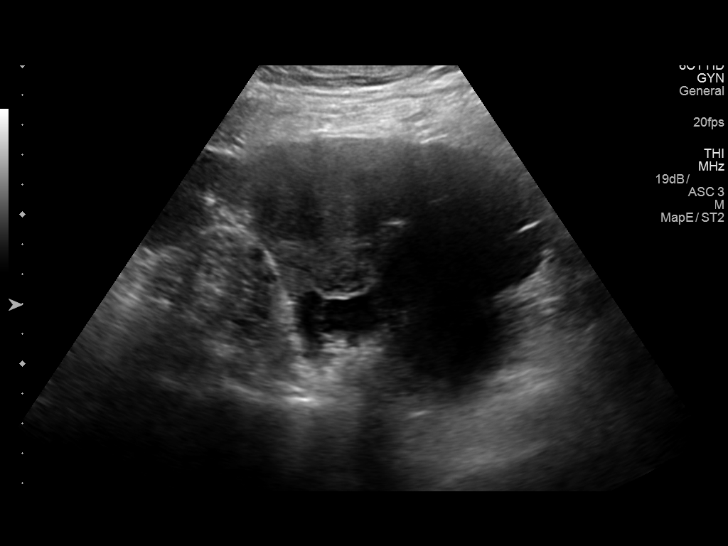

[13 of 25 positions shown; findings below may reference images not displayed]

FINDINGS: Uterus

Measurements: 11.8 x 5.1 x 5.9 cm. Heterogeneous in appearance with
2.8 cm intramural fibroid in the posterior fundus.

Endometrium

Thickness: 5 mm.  No focal abnormality visualized.

Right ovary

Measurements: 6.0 x 3.4 x 4.5 cm. Simple appearing, anechoic 5.1 x
2.9 x 3.5 cm cyst within the right ovary.

Left ovary

A normal left ovary is not identified. There is a large complex,
cystic mass involving the left ovary, measuring 11.5 x 7.8 x
cm. This mass contains several thin internal septations, as well as
a hypoechoic nodule without internal vascularity. There is an
adjacent tubular shaped lesion with low-level internal echoes that
may represent the fallopian tube.

Other findings

No abnormal free fluid.
IMPRESSION: 1. Large 11.5 cm complex, cystic mass involving the left ovary
containing several thin internal septations and a hypoechoic nodule
without definite internal vascularity. Given this appearance,
gynecological consultation and contrast-enhanced pelvic MRI are
recommended for further evaluation. This recommendation follows the
consensus statement: Management of Asymptomatic Ovarian and Other
Adnexal Cysts Imaged at US: Society of Radiologists in Ultrasound
2. Tubular lesion containing low-level internal echoes within the
left adnexa that appears separate from the left ovary may represent
hematosalpinx.
3. 5.1 cm simple appearing cyst in the right ovary. This is almost
certainly benign, but follow up ultrasound is recommended in 1 year
according to the Society of Radiologists in Ultrasound 6252
Consensus Conference Statement (Evodio Baumgartner et al. Management of
Asymptomatic Ovarian and Other Adnexal Cysts Imaged at US: Society
of Radiologists in Ultrasound Consensus Conference Statement 6252.
Radiology [DATE]): [id number].).
4. Fibroid uterus.
